# Patient Record
Sex: Female | Born: 1960 | Race: White | Hispanic: No | Marital: Married | Smoking: Former smoker
Health system: Southern US, Community
[De-identification: ages and names within clinical notes are randomized; demographics above are authoritative.]

## PROBLEM LIST (undated history)

## (undated) DIAGNOSIS — O139 Gestational [pregnancy-induced] hypertension without significant proteinuria, unspecified trimester: Secondary | ICD-10-CM

## (undated) HISTORY — PX: APPENDECTOMY: SHX54

## (undated) HISTORY — PX: OTHER SURGICAL HISTORY: SHX169

## (undated) HISTORY — DX: Gestational (pregnancy-induced) hypertension without significant proteinuria, unspecified trimester: O13.9

---

## 1999-03-27 ENCOUNTER — Inpatient Hospital Stay (HOSPITAL_COMMUNITY): Admission: EM | Admit: 1999-03-27 | Discharge: 1999-03-31 | Payer: Self-pay | Admitting: Emergency Medicine

## 2003-01-11 ENCOUNTER — Emergency Department (HOSPITAL_COMMUNITY): Admission: EM | Admit: 2003-01-11 | Discharge: 2003-01-11 | Payer: Self-pay | Admitting: Emergency Medicine

## 2008-10-10 ENCOUNTER — Encounter: Admission: RE | Admit: 2008-10-10 | Discharge: 2008-10-10 | Payer: Self-pay | Admitting: Obstetrics and Gynecology

## 2011-12-30 ENCOUNTER — Ambulatory Visit (INDEPENDENT_AMBULATORY_CARE_PROVIDER_SITE_OTHER): Payer: Managed Care, Other (non HMO)

## 2011-12-30 DIAGNOSIS — R509 Fever, unspecified: Secondary | ICD-10-CM

## 2011-12-30 DIAGNOSIS — R5381 Other malaise: Secondary | ICD-10-CM

## 2011-12-30 DIAGNOSIS — J029 Acute pharyngitis, unspecified: Secondary | ICD-10-CM

## 2012-03-08 ENCOUNTER — Ambulatory Visit: Payer: Managed Care, Other (non HMO)

## 2012-03-08 ENCOUNTER — Ambulatory Visit (INDEPENDENT_AMBULATORY_CARE_PROVIDER_SITE_OTHER): Payer: Managed Care, Other (non HMO) | Admitting: Family Medicine

## 2012-03-08 VITALS — BP 126/77 | HR 72 | Temp 98.6°F | Resp 16 | Ht 62.5 in | Wt 152.0 lb

## 2012-03-08 DIAGNOSIS — Z780 Asymptomatic menopausal state: Secondary | ICD-10-CM | POA: Insufficient documentation

## 2012-03-08 DIAGNOSIS — S93401A Sprain of unspecified ligament of right ankle, initial encounter: Secondary | ICD-10-CM

## 2012-03-08 DIAGNOSIS — S93409A Sprain of unspecified ligament of unspecified ankle, initial encounter: Secondary | ICD-10-CM

## 2012-03-08 DIAGNOSIS — M79609 Pain in unspecified limb: Secondary | ICD-10-CM

## 2012-03-08 NOTE — Progress Notes (Signed)
  Subjective:    Patient ID: Lisa Huffman, female    DOB: 05/15/1961, 51 y.o.   MRN: [id number]  HPI Sprained/strained R ankle 2 weeks ago just walking through her house.  Pain was so severe she had to sit down.  She has never had a bone density level checked.  Continues to have pain   Review of Systems  All other systems reviewed and are negative.       Objective:   Physical Exam  Nursing note and vitals reviewed. Constitutional: She is oriented to person, place, and time. She appears well-developed and well-nourished.  HENT:  Head: Normocephalic and atraumatic.  Cardiovascular: Normal rate, regular rhythm, normal heart sounds and intact distal pulses.   Pulmonary/Chest: Effort normal and breath sounds normal.  Musculoskeletal: Normal range of motion. She exhibits edema and tenderness.       R ankle TTP over ATF and just posterior to middle aspect of lateral malleolus.  Mild Swelling over the whole area of the lateral malleolus. No erythema, no ecchymoses.  ROM mostly normal, only minimal limitation secondary to swelling. Able to bear weight.  No pain with tib-fib compression.  Neurological: She is alert and oriented to person, place, and time.    [facility] reading (PRIMARY) by  Dr. Alwyn Ren as negative.        Assessment & Plan:  R ankle sprain Sweedeo

## 2012-03-08 NOTE — Patient Instructions (Signed)
Continue RICE therapy.  Start ankle exercises.  Wear sweedeo X 1 week then begin to wean down based on comfort with goal to be out of it in 3-4 weeks.

## 2012-12-07 IMAGING — CR DG ANKLE COMPLETE 3+V*R*
2 series · 2 of 2 positions shown · non-contrast
Comparison: None.

CLINICAL DATA: Twisting injury, pain.

RIGHT ANKLE - COMPLETE 3+ VIEW

[AP]
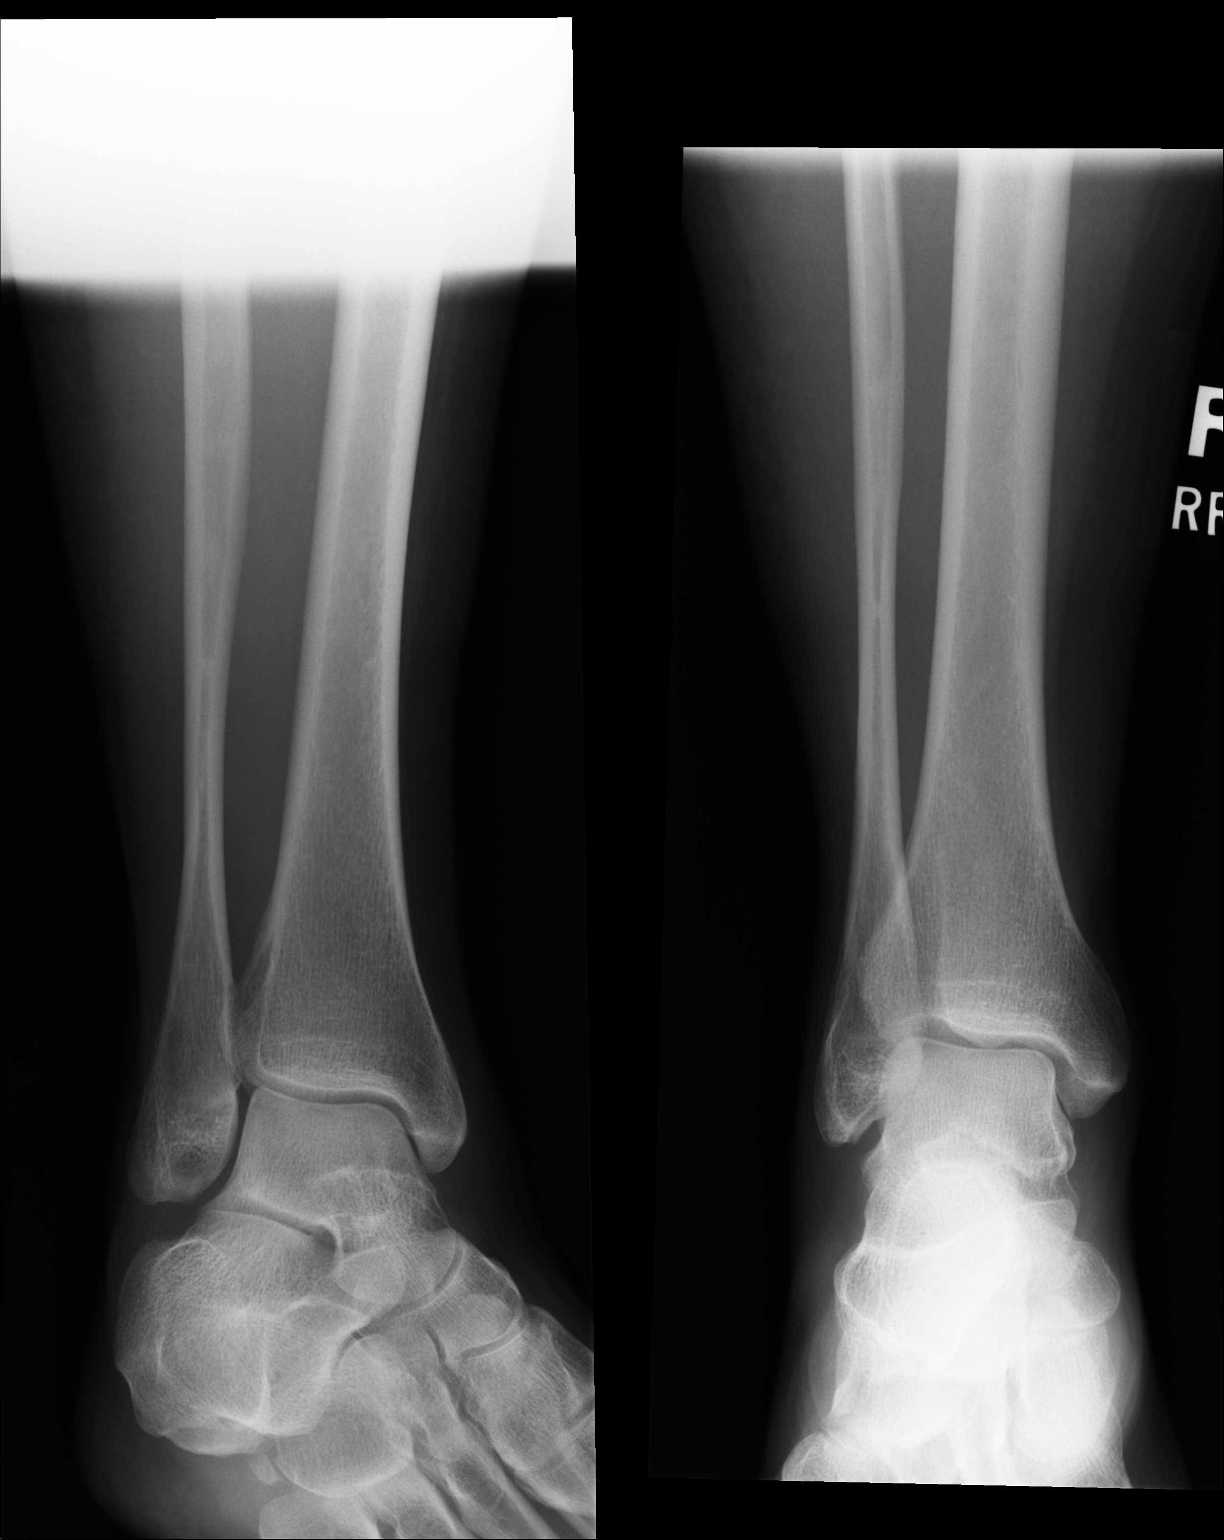

[lateral]
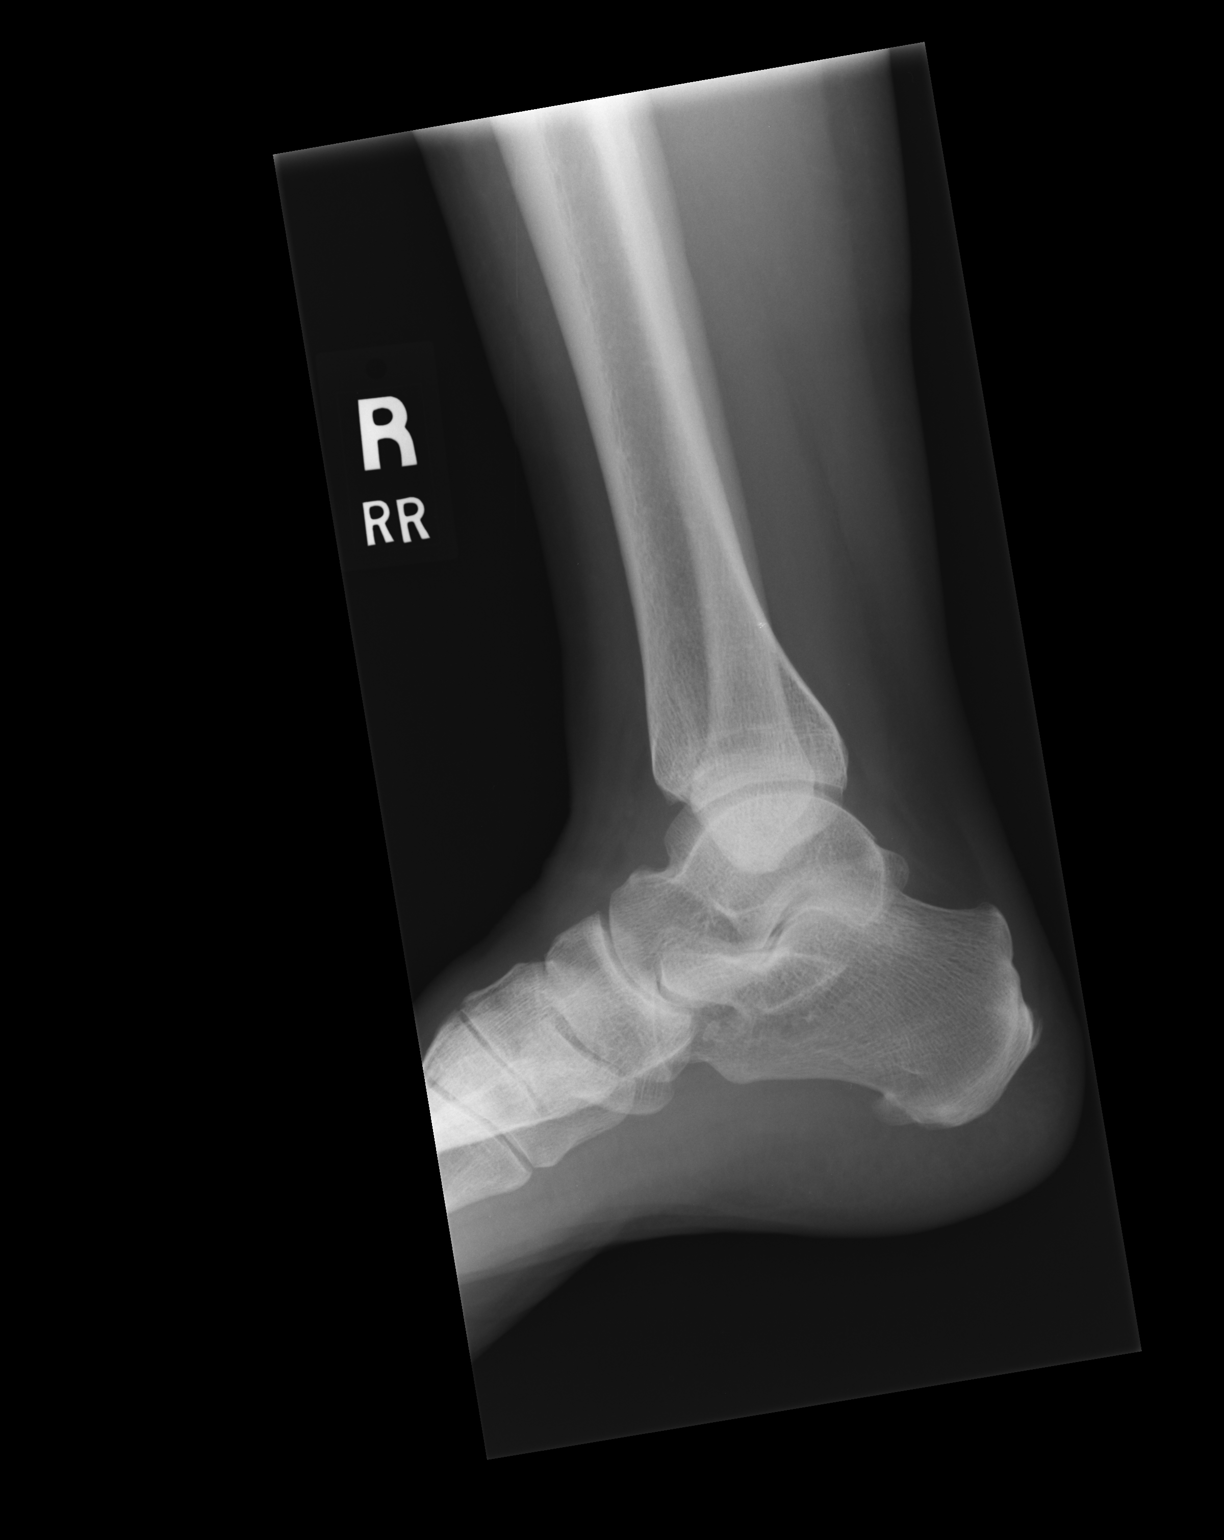

[2 of 2 positions shown; findings below may reference images not displayed]

FINDINGS: No acute bony abnormality.  Specifically, no fracture,
subluxation, or dislocation.  Soft tissues are intact.  Plantar
calcaneal spur.
IMPRESSION: No acute bony abnormality.

## 2014-07-18 ENCOUNTER — Other Ambulatory Visit: Payer: Self-pay

## 2014-07-18 DIAGNOSIS — Z1231 Encounter for screening mammogram for malignant neoplasm of breast: Secondary | ICD-10-CM

## 2014-07-23 ENCOUNTER — Ambulatory Visit
Admission: RE | Admit: 2014-07-23 | Discharge: 2014-07-23 | Disposition: A | Discharge status: Home / Self Care | Payer: Commercial Indemnity | Source: Ambulatory Visit

## 2014-07-23 ENCOUNTER — Encounter (INDEPENDENT_AMBULATORY_CARE_PROVIDER_SITE_OTHER): Payer: Self-pay

## 2014-07-23 DIAGNOSIS — Z1231 Encounter for screening mammogram for malignant neoplasm of breast: Secondary | ICD-10-CM

## 2014-10-05 ENCOUNTER — Ambulatory Visit (INDEPENDENT_AMBULATORY_CARE_PROVIDER_SITE_OTHER): Payer: Managed Care, Other (non HMO) | Admitting: Emergency Medicine

## 2014-10-05 ENCOUNTER — Ambulatory Visit (INDEPENDENT_AMBULATORY_CARE_PROVIDER_SITE_OTHER): Payer: Managed Care, Other (non HMO)

## 2014-10-05 VITALS — BP 120/75 | HR 71 | Temp 98.2°F | Resp 18 | Ht 63.0 in | Wt 161.0 lb

## 2014-10-05 DIAGNOSIS — M25571 Pain in right ankle and joints of right foot: Secondary | ICD-10-CM

## 2014-10-05 DIAGNOSIS — S93401A Sprain of unspecified ligament of right ankle, initial encounter: Secondary | ICD-10-CM

## 2014-10-05 NOTE — Progress Notes (Signed)
[facility] [address], [place] [address] [phone]  Date:  10/05/2014   Name:  Lisa Huffman   DOB:  [birth year]   MRN:  [id number]  PCP:  No PCP Per Patient    Chief Complaint: Ankle Pain   History of Present Illness:  ROSELIE CIRIGLIANO is a 53 y.o. very pleasant female patient who presents with the following:  Injured three weeks ago while helping her daughter move.  She slipped carrying a cox when the handle broke.  She twisted her ankle.  Has been using a brace but still having pain. Able to bear weight and ambulate with some pain.  Mostly on medial ankle. No improvement with over the counter medications or other home remedies.  Denies other complaint or health concern today.   Patient Active Problem List   Diagnosis Date Noted  . Menopause 03/08/2012    No past medical history on file.  No past surgical history on file.  History  Substance Use Topics  . Smoking status: Current Every Day Smoker -- 10.00 packs/day    Types: Cigarettes  . Smokeless tobacco: Not on file  . Alcohol Use: No    No family history on file.  No Known Allergies  Medication list has been reviewed and updated.  Current Outpatient Prescriptions on File Prior to Visit  Medication Sig Dispense Refill  . estrogen, conjugated,-medroxyprogesterone (PREMPRO) 0.45-1.5 MG per tablet Take 1 tablet by mouth daily.       No current facility-administered medications on file prior to visit.    Review of Systems:  As per HPI, otherwise negative.    Physical Examination: Filed Vitals:   10/05/14 1233  BP: 120/75  Pulse: 71  Temp: 98.2 F (36.8 C)  Resp: 18   Filed Vitals:   10/05/14 1233  Height: 5\' 3"  (1.6 m)  Weight: 161 lb (73.029 kg)   Body mass index is 28.53 kg/(m^2). Ideal Body Weight: Weight in (lb) to have BMI = 25: 140.8   GEN: WDWN, NAD, Non-toxic, Alert & Oriented x 3 HEENT: Atraumatic, Normocephalic.  Ears and Nose: No external  deformity. EXTR: No clubbing/cyanosis/edema NEURO: Normal gait.  PSYCH: Normally interactive. Conversant. Not depressed or anxious appearing.  Calm demeanor.  RIGHT ankle:  Tender medial malleolus. No deformity or ecchymosis.  Assessment and Plan: Sprain ankle RICE  Signed,  Ellison Carwin, MD  [facility] reading (PRIMARY) by  Dr. Ouida Sills.  Negative .

## 2014-10-05 NOTE — Patient Instructions (Signed)

## 2017-01-15 DIAGNOSIS — J189 Pneumonia, unspecified organism: Secondary | ICD-10-CM | POA: Diagnosis not present

## 2017-11-15 DIAGNOSIS — M65331 Trigger finger, right middle finger: Secondary | ICD-10-CM | POA: Diagnosis not present

## 2017-12-13 DIAGNOSIS — M65331 Trigger finger, right middle finger: Secondary | ICD-10-CM | POA: Diagnosis not present

## 2018-03-22 DIAGNOSIS — J205 Acute bronchitis due to respiratory syncytial virus: Secondary | ICD-10-CM | POA: Diagnosis not present

## 2018-03-22 DIAGNOSIS — J0100 Acute maxillary sinusitis, unspecified: Secondary | ICD-10-CM | POA: Diagnosis not present

## 2018-04-11 ENCOUNTER — Ambulatory Visit: Payer: BLUE CROSS/BLUE SHIELD | Admitting: Cardiovascular Disease

## 2018-04-11 ENCOUNTER — Encounter: Payer: Self-pay | Admitting: Cardiovascular Disease

## 2018-04-11 VITALS — BP 120/92 | HR 79 | Ht 63.0 in | Wt 188.0 lb

## 2018-04-11 DIAGNOSIS — R072 Precordial pain: Secondary | ICD-10-CM | POA: Diagnosis not present

## 2018-04-11 DIAGNOSIS — Z6833 Body mass index (BMI) 33.0-33.9, adult: Secondary | ICD-10-CM | POA: Diagnosis not present

## 2018-04-11 DIAGNOSIS — Z8249 Family history of ischemic heart disease and other diseases of the circulatory system: Secondary | ICD-10-CM | POA: Diagnosis not present

## 2018-04-11 DIAGNOSIS — E6609 Other obesity due to excess calories: Secondary | ICD-10-CM | POA: Diagnosis not present

## 2018-04-11 DIAGNOSIS — R5383 Other fatigue: Secondary | ICD-10-CM

## 2018-04-11 LAB — COMPREHENSIVE METABOLIC PANEL
ALT: 31 IU/L (ref 0–32)
AST: 28 IU/L (ref 0–40)
Albumin/Globulin Ratio: 1.7 (ref 1.2–2.2)
Albumin: 4.7 g/dL (ref 3.5–5.5)
Alkaline Phosphatase: 80 IU/L (ref 39–117)
BUN / CREAT RATIO: 18 (ref 9–23)
BUN: 17 mg/dL (ref 6–24)
Bilirubin Total: 0.3 mg/dL (ref 0.0–1.2)
CO2: 23 mmol/L (ref 20–29)
CREATININE: 0.92 mg/dL (ref 0.57–1.00)
Calcium: 9.6 mg/dL (ref 8.7–10.2)
Chloride: 106 mmol/L (ref 96–106)
GFR calc non Af Amer: 69 mL/min/{1.73_m2} (ref >59)
GFR, EST AFRICAN AMERICAN: 80 mL/min/{1.73_m2} (ref >59)
GLOBULIN, TOTAL: 2.7 g/dL (ref 1.5–4.5)
Glucose: 110 mg/dL — ABNORMAL HIGH (ref 65–99)
Potassium: 4.4 mmol/L (ref 3.5–5.2)
SODIUM: 144 mmol/L (ref 134–144)
TOTAL PROTEIN: 7.4 g/dL (ref 6.0–8.5)

## 2018-04-11 LAB — CBC
HEMATOCRIT: 43.5 % (ref 34.0–46.6)
Hemoglobin: 14.5 g/dL (ref 11.1–15.9)
MCH: 29.2 pg (ref 26.6–33.0)
MCHC: 33.3 g/dL (ref 31.5–35.7)
MCV: 88 fL (ref 79–97)
Platelets: 373 10*3/uL (ref 150–379)
RBC: 4.97 x10E6/uL (ref 3.77–5.28)
RDW: 13.3 % (ref 12.3–15.4)
WBC: 6.6 10*3/uL (ref 3.4–10.8)

## 2018-04-11 LAB — LIPID PANEL W/O CHOL/HDL RATIO
Cholesterol, Total: 172 mg/dL (ref 100–199)
HDL: 42 mg/dL (ref >39)
LDL CALC: 112 mg/dL — AB (ref 0–99)
Triglycerides: 90 mg/dL (ref 0–149)
VLDL CHOLESTEROL CAL: 18 mg/dL (ref 5–40)

## 2018-04-11 NOTE — Patient Instructions (Signed)
Medication Instructions: Dr Sallyanne Kuster recommends that you continue on your current medications as directed. Please refer to the Current Medication list given to you today.  Labwork: Your physician recommends that you return for lab work at your earliest [place].  Testing/Procedures: 1. Exercise Stress test - Your physician has requested that you have an exercise tolerance test. For further information please visit [email or web address]. Please also follow instruction sheet, as given.  Follow-up: Dr Sallyanne Kuster recommends that you follow-up with him as needed.  If you need a refill on your cardiac medications before your next appointment, please call your pharmacy.

## 2018-04-11 NOTE — Progress Notes (Signed)
Cardiology Office Note:    Date:  04/13/2018   ID:  Lisa Huffman, DOB 1961-03-18, MRN [id number]  PCP:  Patient, No Pcp Per  Cardiologist:  No primary care provider on file. New  Referring MD: No ref. provider found   Chief Complaint  Patient presents with  . Follow-up  . Chest Pain  Family history of early death from coronary disease  History of Present Illness:    Lisa Huffman is a 57 y.o. female with a hx of occasional mild elevation in blood pressure, gastroesophageal reflux disease and a family history of early death from coronary artery disease.  Her brother had his myocardial infarction at age 11 and then died suddenly at age 91.  Has been experiencing chest pressure at rest.  It never happens with physical activity even when she engages is not relatively strenuous housework or yardwork.  She wonders whether her chest discomfort might be related to emotional distress.  She is taking care of her 49 year old mother whose needs are increasing rapidly; her mother has atrial fibrillation.  She is also providing baby care for her 45 year old daughter who has type 1 diabetes.  She stopped smoking about a year ago and has gained a lot of weight since then.  She is now mildly obese.  Her blood pressure has been high "off-and-on".  It seems to be higher when she is under emotional stress.  She denies exertional dyspnea, palpitations, dizziness, syncope, leg edema, intermittent claudication or focal neurological complaints.  She does complain of constant fatigue, not true hypersomnolence.  She does not snore.  Smoked a half a pack of cigarettes a day for roughly 30 years.  She quit in January 2018.  She had gestational diabetes at age 78.  Past Medical History:  Diagnosis Date  . Gestational hypertension    Age 47    Past Surgical History:  Procedure Laterality Date  . APPENDECTOMY    . CESAREAN SECTION    . Left shoulder surgery    . Right shoulder surgery      Current  Medications: Current Meds  Medication Sig  . omeprazole (PRILOSEC) 10 MG capsule Take 10 mg by mouth daily.     Allergies:   Patient has no known allergies.   Social History   Socioeconomic History  . Marital status: Married    Spouse name: Not on file  . Number of children: Not on file  . Years of education: Not on file  . Highest education level: Not on file  Occupational History  . Not on file  Social Needs  . Financial resource strain: Not on file  . Food insecurity:    Worry: Not on file    Inability: Not on file  . Transportation needs:    Medical: Not on file    Non-medical: Not on file  Tobacco Use  . Smoking status: Former Smoker    Packs/day: 10.00    Types: Cigarettes  . Smokeless tobacco: Never Used  Substance and Sexual Activity  . Alcohol use: No  . Drug use: No  . Sexual activity: Yes  Lifestyle  . Physical activity:    Days per week: Not on file    Minutes per session: Not on file  . Stress: Not on file  Relationships  . Social connections:    Talks on phone: Not on file    Gets together: Not on file    Attends religious service: Not on file    Active member  of club or organization: Not on file    Attends meetings of clubs or organizations: Not on file    Relationship status: Not on file  Other Topics Concern  . Not on file  Social History Narrative  . Not on file     Family History: The patient's family history includes Atrial fibrillation in her mother; Pancreatic cancer in her father.  ROS:   Please see the history of present illness.     All other systems reviewed and are negative.  EKGs/Labs/Other Studies Reviewed:    EKG:  EKG is  ordered today.  The ekg ordered today demonstrates normal sinus rhythm, incomplete right bundle branch block, normal QTC  Recent Labs: 04/11/2018: ALT 31; BUN 17; Creatinine, Ser 0.92; Hemoglobin 14.5; Platelets 373; Potassium 4.4; Sodium 144  Recent Lipid Panel    Component Value Date/Time   CHOL  172 04/11/2018 0853   TRIG 90 04/11/2018 0853   HDL 42 04/11/2018 0853   LDLCALC 112 (H) 04/11/2018 0853    Physical Exam:    VS:  BP (!) 120/92 (BP Location: Left Arm, Patient Position: Sitting, Cuff Size: Normal)   Pulse 79   Ht 5\' 3"  (1.6 m)   Wt 188 lb (85.3 kg)   BMI 33.30 kg/m     Wt Readings from Last 3 Encounters:  04/11/18 188 lb (85.3 kg)  10/05/14 161 lb (73 kg)  03/08/12 152 lb (68.9 kg)     GEN: Mildly obese, well nourished, well developed in no acute distress HEENT: Normal NECK: No JVD; No carotid bruits LYMPHATICS: No lymphadenopathy CARDIAC: RRR, no murmurs, rubs, gallops RESPIRATORY:  Clear to auscultation without rales, wheezing or rhonchi  ABDOMEN: Soft, non-tender, non-distended MUSCULOSKELETAL:  No edema; No deformity  SKIN: Warm and dry NEUROLOGIC:  Alert and oriented x 3 PSYCHIATRIC:  Normal affect   ASSESSMENT:    1. Precordial pain   2. Family history of coronary artery disease   3. Class 1 obesity due to excess calories without serious comorbidity with body mass index (BMI) of 33.0 to 33.9 in adult   4. Other fatigue    PLAN:    In order of problems listed above:  1. Chest pain: Her chest symptoms are atypical and I doubt that they are cardiac in etiology.  However she does have a family history of remarkably premature coronary disease and she smoked until recently.  I recommended an exercise treadmill stress test.   2. Obesity: In the long run, strongly recommend efforts at weight loss, with increased physical exercise and improved dietary compliance.  Right now she finds it difficult to stick to a healthy diet due to her numerous family obligations. 3. Family history CAD: We will check her lipid profile and other risk factors.   Medication Adjustments/Labs and Tests Ordered: Current medicines are reviewed at length with the patient today.  Concerns regarding medicines are outlined above.  Orders Placed This Encounter  Procedures  . CBC    . Comprehensive metabolic panel  . Lipid Panel w/o Chol/HDL Ratio  . EXERCISE TOLERANCE TEST (ETT)  . EKG 12-Lead   No orders of the defined types were placed in this encounter.   Patient Instructions  Medication Instructions: Dr Sallyanne Kuster recommends that you continue on your current medications as directed. Please refer to the Current Medication list given to you today.  Labwork: Your physician recommends that you return for lab work at your earliest [place].  Testing/Procedures: 1. Exercise Stress test -  Your physician has requested that you have an exercise tolerance test. For further information please visit [email or web address]. Please also follow instruction sheet, as given.  Follow-up: Dr Sallyanne Kuster recommends that you follow-up with him as needed.  If you need a refill on your cardiac medications before your next appointment, please call your pharmacy.    Signed, Sanda Klein, MD  04/13/2018 6:35 PM    [place]

## 2018-04-13 ENCOUNTER — Encounter: Payer: Self-pay | Admitting: Cardiovascular Disease

## 2018-05-11 ENCOUNTER — Telehealth (HOSPITAL_COMMUNITY): Payer: Self-pay

## 2018-05-11 NOTE — Telephone Encounter (Signed)
Encounter complete. 

## 2018-05-12 ENCOUNTER — Telehealth (HOSPITAL_COMMUNITY): Payer: Self-pay

## 2018-05-12 NOTE — Telephone Encounter (Signed)
Encounter complete. 

## 2018-05-13 ENCOUNTER — Ambulatory Visit (HOSPITAL_COMMUNITY)
Admission: RE | Admit: 2018-05-13 | Discharge: 2018-05-13 | Disposition: A | Discharge status: Home / Self Care | Payer: BLUE CROSS/BLUE SHIELD | Source: Ambulatory Visit | Attending: Cardiovascular Disease | Admitting: Cardiovascular Disease

## 2018-05-13 DIAGNOSIS — Z8249 Family history of ischemic heart disease and other diseases of the circulatory system: Secondary | ICD-10-CM | POA: Insufficient documentation

## 2018-05-13 LAB — EXERCISE TOLERANCE TEST
CHL CUP RESTING HR STRESS: 68 {beats}/min
CHL RATE OF PERCEIVED EXERTION: 18
CSEPED: 6 min
Estimated workload: 7.7 METS
Exercise duration (sec): 28 s
MPHR: 163 {beats}/min
Peak HR: 157 {beats}/min
Percent HR: 96 %

## 2020-05-07 ENCOUNTER — Other Ambulatory Visit: Payer: Self-pay

## 2020-05-07 DIAGNOSIS — Z1231 Encounter for screening mammogram for malignant neoplasm of breast: Secondary | ICD-10-CM

## 2022-12-07 ENCOUNTER — Other Ambulatory Visit: Payer: Self-pay | Admitting: Family Medicine

## 2022-12-07 DIAGNOSIS — D1720 Benign lipomatous neoplasm of skin and subcutaneous tissue of unspecified limb: Secondary | ICD-10-CM

## 2022-12-15 ENCOUNTER — Ambulatory Visit
Admission: RE | Admit: 2022-12-15 | Discharge: 2022-12-15 | Disposition: A | Discharge status: Home / Self Care | Payer: BC Managed Care – PPO | Source: Ambulatory Visit | Attending: Family Medicine | Admitting: Family Medicine

## 2022-12-15 DIAGNOSIS — D1720 Benign lipomatous neoplasm of skin and subcutaneous tissue of unspecified limb: Secondary | ICD-10-CM

## 2022-12-26 LAB — EXTERNAL GENERIC LAB PROCEDURE: COLOGUARD: NEGATIVE
